# Patient Record
Sex: Female | Born: 1975 | Race: White | Hispanic: No | Marital: Married | Smoking: Never smoker
Health system: Southern US, Community
[De-identification: ages and names within clinical notes are randomized; demographics above are authoritative.]

## PROBLEM LIST (undated history)

## (undated) HISTORY — PX: APPENDECTOMY: SHX54

---

## 1998-02-09 ENCOUNTER — Emergency Department (HOSPITAL_COMMUNITY): Admission: EM | Admit: 1998-02-09 | Discharge: 1998-02-09 | Payer: Self-pay | Admitting: Emergency Medicine

## 1998-03-11 ENCOUNTER — Emergency Department (HOSPITAL_COMMUNITY): Admission: EM | Admit: 1998-03-11 | Discharge: 1998-03-11 | Payer: Self-pay | Admitting: Emergency Medicine

## 1998-04-13 ENCOUNTER — Emergency Department (HOSPITAL_COMMUNITY): Admission: EM | Admit: 1998-04-13 | Discharge: 1998-04-13 | Payer: Self-pay | Admitting: Emergency Medicine

## 1998-08-19 ENCOUNTER — Encounter: Payer: Self-pay | Admitting: Endocrinology

## 1998-08-19 ENCOUNTER — Ambulatory Visit (HOSPITAL_COMMUNITY): Admission: RE | Admit: 1998-08-19 | Discharge: 1998-08-19 | Payer: Self-pay | Admitting: Endocrinology

## 2007-06-14 ENCOUNTER — Emergency Department (HOSPITAL_COMMUNITY): Admission: EM | Admit: 2007-06-14 | Discharge: 2007-06-14 | Payer: Self-pay | Admitting: Emergency Medicine

## 2007-07-04 ENCOUNTER — Ambulatory Visit: Payer: Self-pay | Admitting: Family Medicine

## 2007-07-04 DIAGNOSIS — I10 Essential (primary) hypertension: Secondary | ICD-10-CM | POA: Insufficient documentation

## 2007-07-04 DIAGNOSIS — N3945 Continuous leakage: Secondary | ICD-10-CM

## 2007-07-04 DIAGNOSIS — D509 Iron deficiency anemia, unspecified: Secondary | ICD-10-CM

## 2007-07-05 ENCOUNTER — Encounter: Payer: Self-pay | Admitting: Family Medicine

## 2007-07-05 LAB — CONVERTED CEMR LAB
HCT: 41 % (ref 36.0–46.0)
Hemoglobin: 13.7 g/dL (ref 12.0–15.0)
MCHC: 33.4 g/dL (ref 30.0–36.0)
MCV: 87.6 fL (ref 78.0–100.0)
Platelets: 224 10*3/uL (ref 150–400)
RBC: 4.68 M/uL (ref 3.87–5.11)
RDW: 12.8 % (ref 11.5–15.5)
WBC: 7.5 10*3/uL (ref 4.0–10.5)

## 2007-08-15 ENCOUNTER — Telehealth (INDEPENDENT_AMBULATORY_CARE_PROVIDER_SITE_OTHER): Payer: Self-pay | Admitting: *Deleted

## 2007-08-15 ENCOUNTER — Ambulatory Visit: Payer: Self-pay | Admitting: Family Medicine

## 2007-08-15 DIAGNOSIS — E669 Obesity, unspecified: Secondary | ICD-10-CM | POA: Insufficient documentation

## 2007-08-15 LAB — CONVERTED CEMR LAB: Beta hcg, urine, semiquantitative: NEGATIVE

## 2007-08-28 ENCOUNTER — Ambulatory Visit: Payer: Self-pay | Admitting: Family Medicine

## 2007-11-07 ENCOUNTER — Telehealth: Payer: Self-pay | Admitting: Family Medicine

## 2007-11-23 ENCOUNTER — Ambulatory Visit: Payer: Self-pay | Admitting: Family Medicine

## 2007-11-23 LAB — CONVERTED CEMR LAB: Inflenza A Ag: NEGATIVE

## 2008-05-24 ENCOUNTER — Ambulatory Visit: Payer: Self-pay | Admitting: Family Medicine

## 2009-05-13 ENCOUNTER — Ambulatory Visit: Payer: Self-pay | Admitting: Family Medicine

## 2009-05-13 DIAGNOSIS — F341 Dysthymic disorder: Secondary | ICD-10-CM

## 2009-09-02 IMAGING — CR DG HAND COMPLETE 3+V*L*
3 series · 3 of 3 positions shown · non-contrast
Comparison: None.

CLINICAL DATA: Left hand pain following an injury.

LEFT HAND - COMPLETE 3+ VIEW

[view not recorded (1 of 3)]
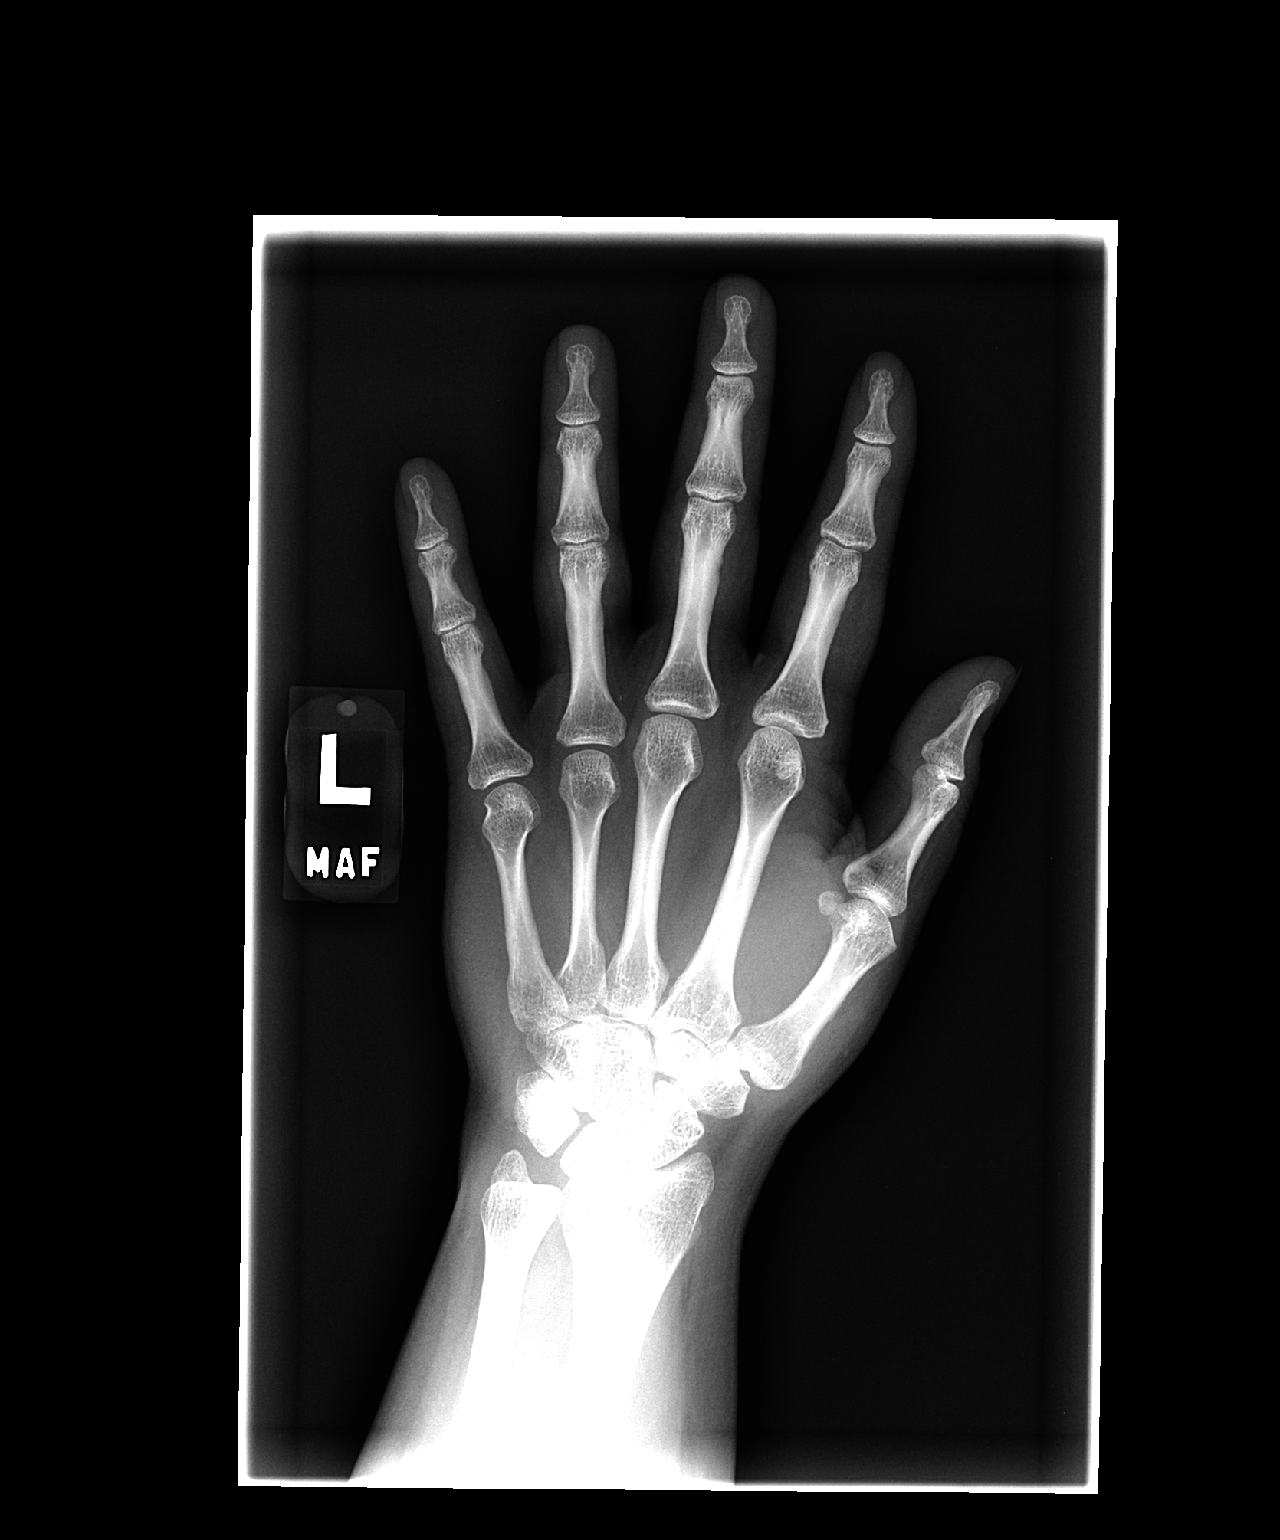

[view not recorded (2 of 3)]
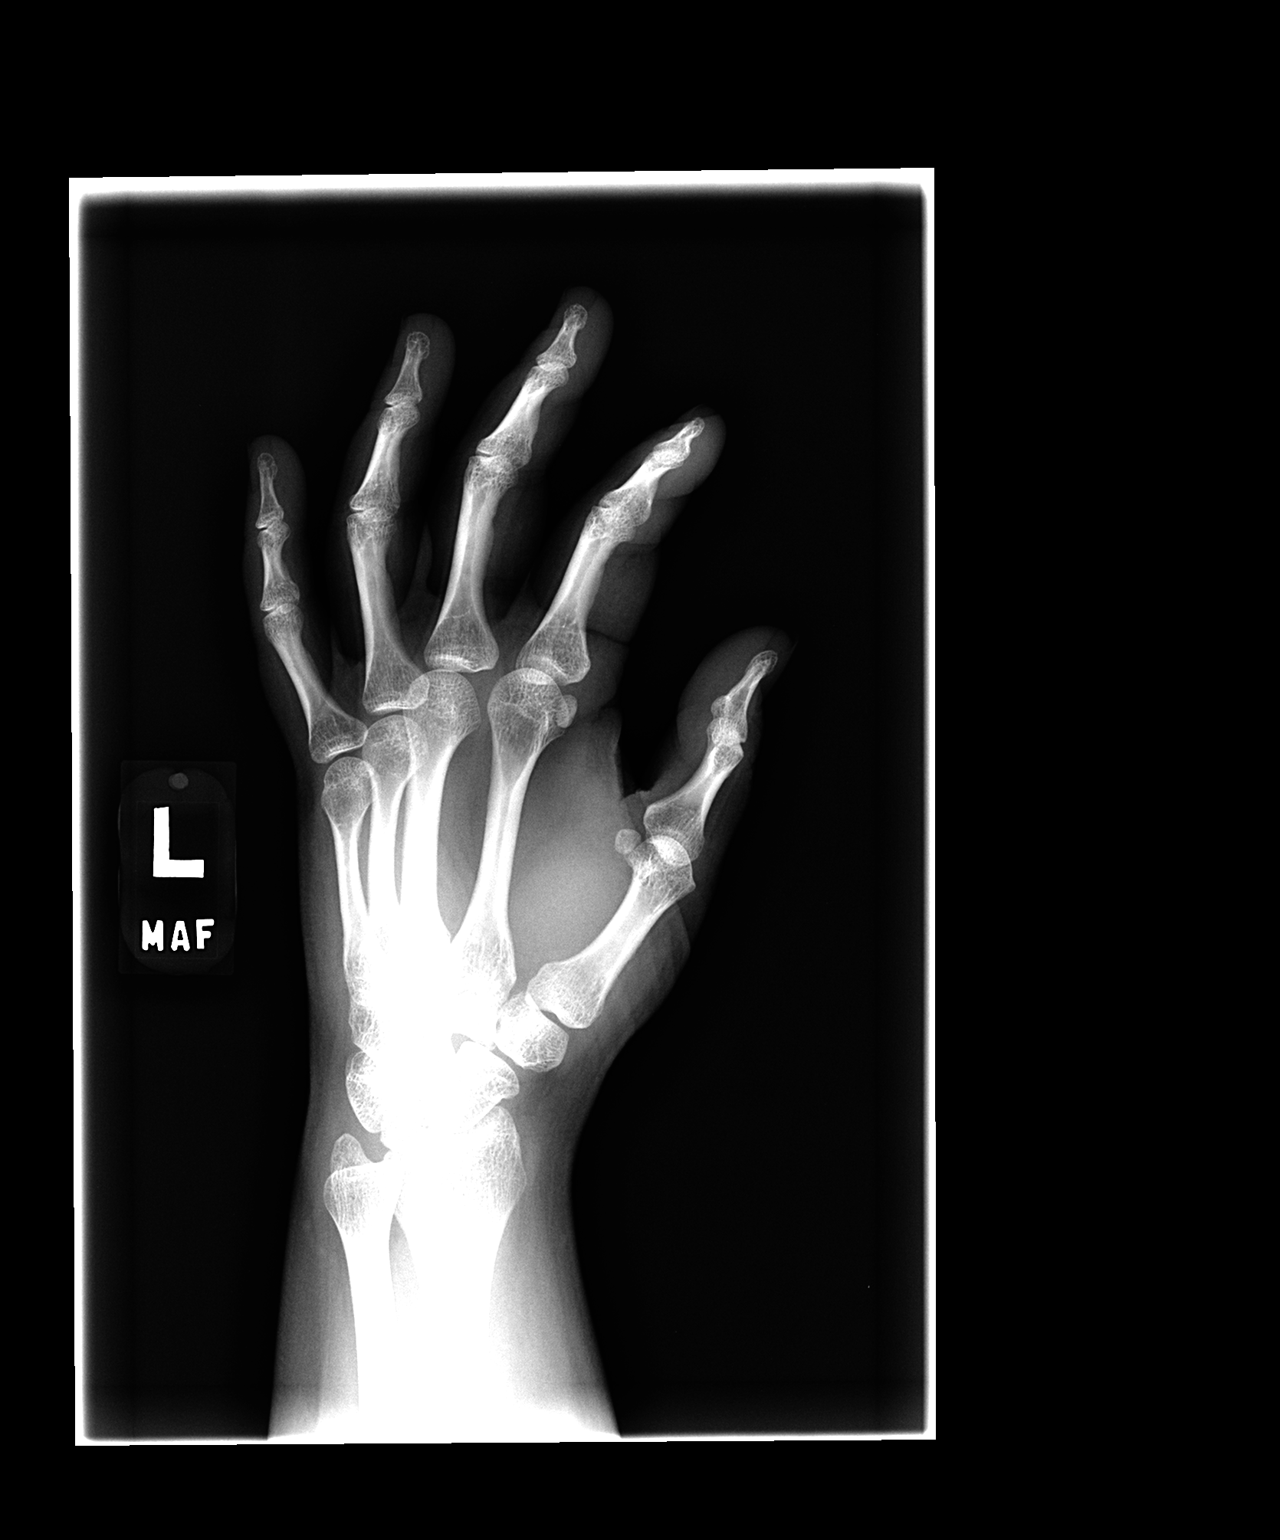

[view not recorded (3 of 3)]
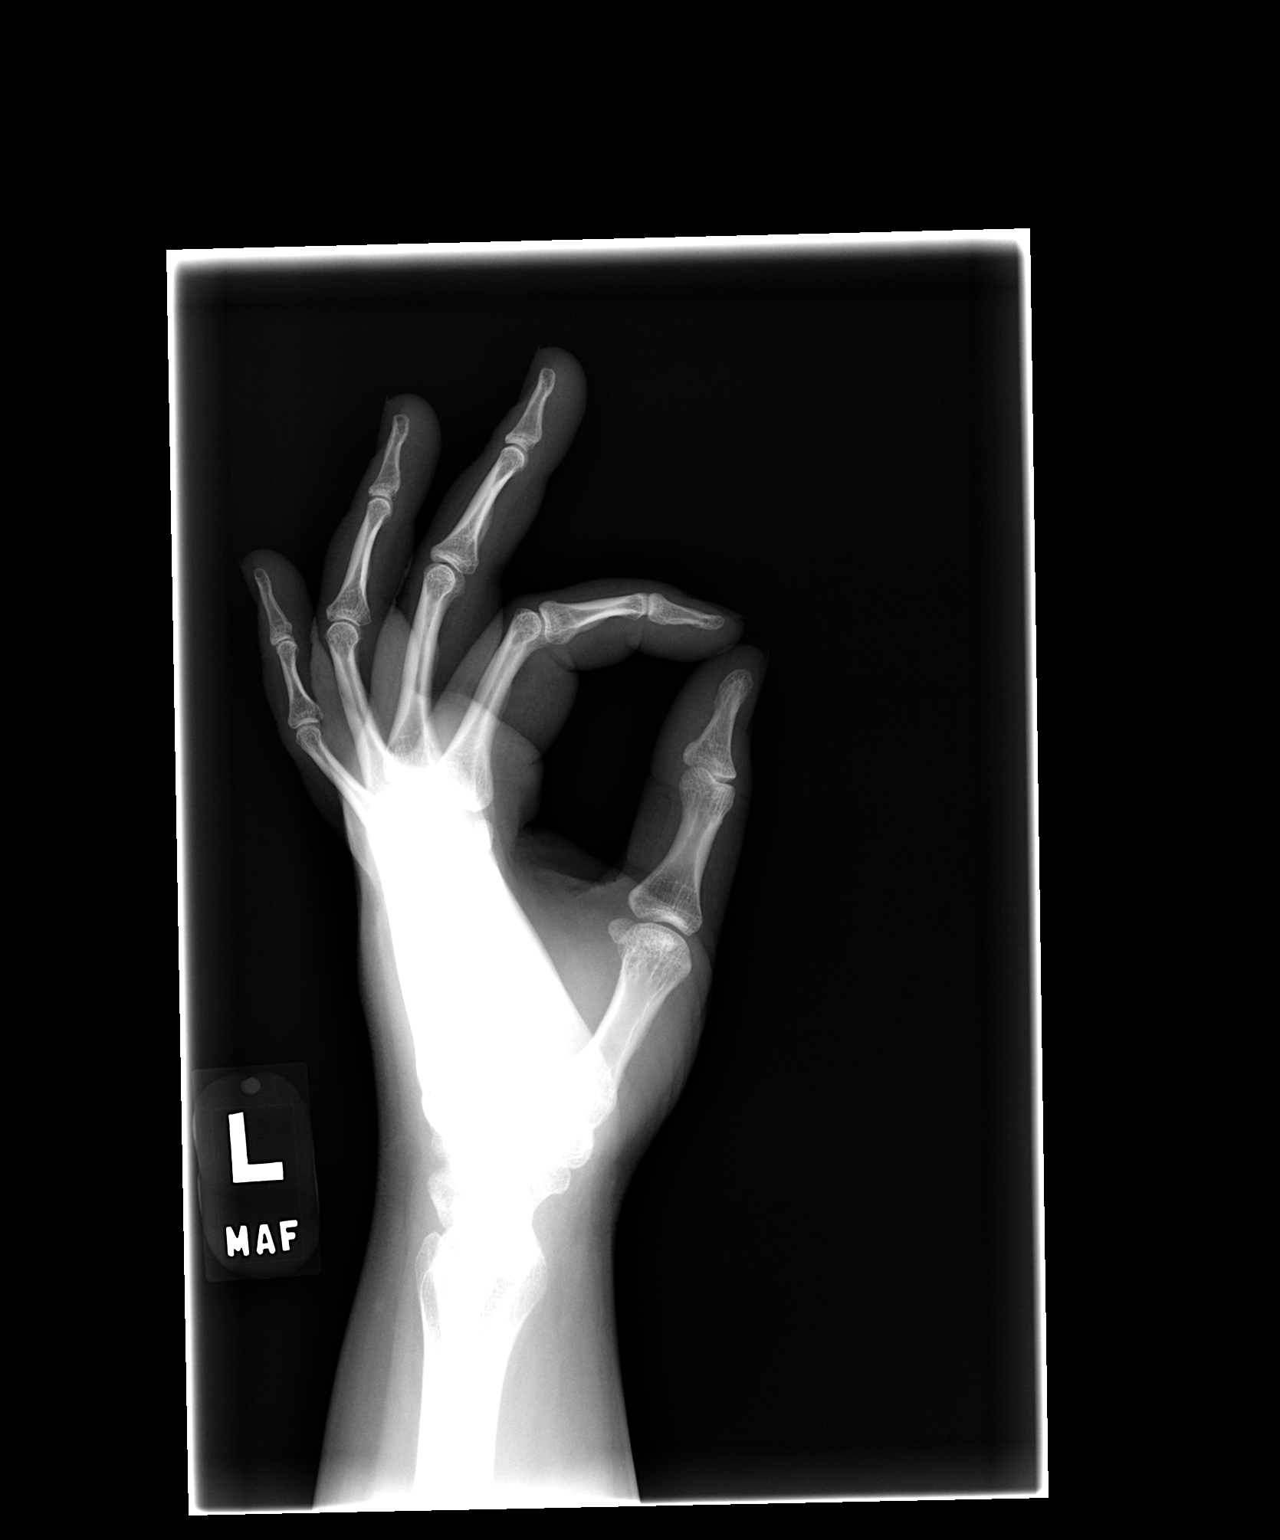

[3 of 3 positions shown; findings below may reference images not displayed]

FINDINGS: Mild soft tissue swelling involving the proximal index,
middle and ring fingers.  Tiny radiopaque density adjacent to the
proximal portion of the fourth proximal phalanx, radially.  This is
only seen on the PA view and may represent and artifact.  No
fracture or dislocation seen.
IMPRESSION: No fracture.

REF:W2 DICTATED: 05/24/2008 [DATE]

## 2010-05-06 ENCOUNTER — Ambulatory Visit
Admission: RE | Admit: 2010-05-06 | Discharge: 2010-05-06 | Payer: Self-pay | Source: Home / Self Care | Attending: Family Medicine | Admitting: Family Medicine

## 2010-05-06 DIAGNOSIS — J019 Acute sinusitis, unspecified: Secondary | ICD-10-CM | POA: Insufficient documentation

## 2010-05-19 NOTE — Letter (Signed)
Summary: Katharina Caper NP  Katharina Caper NP   Imported By: Lanelle Bal 05/17/2009 11:16:04  _____________________________________________________________________  External Attachment:    Type:   Image     Comment:   External Document

## 2010-05-19 NOTE — Assessment & Plan Note (Signed)
Summary: Acute situation depression, atypical CP   Vital Signs:  Patient profile:   35 year old female Height:      60 inches Weight:      207 pounds BMI:     40.57 Pulse rate:   78 / minute BP sitting:   133 / 88  (left arm) Cuff size:   regular  Vitals Entered By: Kathlene November (May 13, 2009 11:13 AM) CC: BP elevated at work today and was sent over   Primary Care Provider:  Linford Arnold C  CC:  BP elevated at work today and was sent over.  History of Present Illness: BP elevated at work today and was sent over. Feels realy stressed that her work is closing.  Hasn't sleep well since found out on 1/19. Feels like in a cloud because can't figure out what to do. Feels like it is causing chest pain. CP started this AM and has been intermittant today.  Feels like in shock.  Knows she has decisions to make. Has gained 7 pounds.  Feels she is starting to sabotage her health but eating poorly. Has stopped working out for the last 2 weeks.  When CP started this AM when to see the work nurse.  Had mid-sternal CP that started this AM. Thought initially it was from eating pizza the night before.  No alleviating or worsneing sxs. No diarphoresis or SOB.   Brought in copy of hte nurse note adn the normal EKG she had done t wokr. No diziness.   Current Medications (verified): 1)  P D Natal Vitamins/folic Acid   Tabs (Prenatal Multivit-Min-Fe-Fa) .... Take One Tablet By Mouth Three Times A Day 2)  Ibuprofen 800 Mg Tabs (Ibuprofen) .Marland Kitchen.. 1 By Mouth Q8hr Pc  Allergies (verified): 1)  ! Sulfa  Comments:  Nurse/Medical Assistant: The patient's medications and allergies were reviewed with the patient and were updated in the Medication and Allergy Lists. Kathlene November (May 13, 2009 11:14 AM)  Physical Exam  General:  Well-developed,well-nourished,in no acute distress; alert,appropriate and cooperative throughout examination   Impression & Recommendations:  Problem # 1:  DEPRESSION, SITUATIONAL,  ACUTE (ICD-300.4) Had a long discussion about options. Discussed utiliziing the counselors at work that are free to help her make some decisions about where to go with her future. Also discussed medications to help with her anxiety, insomnia, lack of focus, and tearfulness.  She would like to try this. Will start with citaloproam and have her f/u in 3 weeks .Call if any suicidal ideation, etc.  also let her know that her carb cravings et are likely from her fatigue and insomnia. 25 min spent face to face in counseling and discussion.   Problem # 2:  ESSENTIAL HYPERTENSION, BENIGN (ICD-401.1) BP looks great today and she has been off HCTZ for almost a year since she was able to lose weight. EKg was noraml. I really htink her CP was stress induced. She is owtherwise low risk.   The following medications were removed from the medication list:    Hydrochlorothiazide 25 Mg Tabs (Hydrochlorothiazide) .Marland Kitchen... Take 1 tablet by mouth once a day  Complete Medication List: 1)  P D Natal Vitamins/folic Acid Tabs (Prenatal multivit-min-fe-fa) .... Take one tablet by mouth three times a day 2)  Ibuprofen 800 Mg Tabs (Ibuprofen) .Marland Kitchen.. 1 by mouth q8hr pc 3)  Citalopram Hydrobromide 20 Mg Tabs (Citalopram hydrobromide) .... Take 1/2 tablet by mouth once a day forone week then increase to 1 tab daily. Prescriptions:  CITALOPRAM HYDROBROMIDE 20 MG TABS (CITALOPRAM HYDROBROMIDE) Take 1/2 tablet by mouth once a day forone week then increase to 1 tab daily.  #30 x 0   Entered and Authorized by:   Nani Gasser MD   Signed by:   Nani Gasser MD on 05/13/2009   Method used:   Electronically to        [facility] [address] [phone]* (retail)       [address]       [place], [place]  [address]       Ph: [phone]       Fax: [phone]   RxID:   [id number]

## 2010-05-21 NOTE — Assessment & Plan Note (Signed)
Summary: Sinusitis   Vital Signs:  Patient profile:   35 year old female Height:      60 inches Weight:      203 pounds Pulse rate:   97 / minute BP sitting:   136 / 102  (right arm) Cuff size:   regular  Vitals Entered By: Avon Gully CMA, Duncan Dull) (May 06, 2010 8:32 AM) CC: cough and congestion x 1 month   Primary Care Provider:  Linford Arnold C  CC:  cough and congestion x 1 month.  History of Present Illness: cough and congestion x 1 month.  No fever. Severe ,nasal congestion, green discharge.  Cough and now chest is sore.  No GI sxs. + sick contacts. Has tried multiple OTC meds, but just not getting better.    Current Medications (verified): 1)  P D Natal Vitamins/folic Acid   Tabs (Prenatal Multivit-Min-Fe-Fa) .... Take One Tablet By Mouth Three Times A Day 2)  Ibuprofen 800 Mg Tabs (Ibuprofen) .Marland Kitchen.. 1 By Mouth Q8hr Pc 3)  Citalopram Hydrobromide 20 Mg Tabs (Citalopram Hydrobromide) .... Take 1/2 Tablet By Mouth Once A Day Forone Week Then Increase To 1 Tab Daily.  Allergies (verified): 1)  ! Sulfa  Comments:  Nurse/Medical Assistant: The patient's medications and allergies were reviewed with the patient and were updated in the Medication and Allergy Lists. Avon Gully CMA, Duncan Dull) (May 06, 2010 8:33 AM)  Physical Exam  General:  Well-developed,well-nourished,in no acute distress; alert,appropriate and cooperative throughout examination Head:  Normocephalic and atraumatic without obvious abnormalities. No apparent alopecia or balding. Eyes:  No corneal or conjunctival inflammation noted. EOMI. Perrla. Ears:  External ear exam shows no significant lesions or deformities.  Otoscopic examination reveals clear canals, tympanic membranes are intact bilaterally without bulging, retraction, inflammation or discharge. Hearing is grossly normal bilaterally. Nose:  External nasal examination shows no deformity or inflammation.  Mouth:  Oral mucosa and oropharynx  without lesions or exudates.  Teeth in good repair. Neck:  No deformities, masses, or tenderness noted. Lungs:  Normal respiratory effort, chest expands symmetrically. Lungs are clear to auscultation, no crackles or wheezes. Heart:  Normal rate and regular rhythm. S1 and S2 normal without gallop, murmur, click, rub or other extra sounds. Pulses:  Radial 2+  Skin:  no rashes.   Cervical Nodes:  No lymphadenopathy noted Psych:  Cognition and judgment appear intact. Alert and cooperative with normal attention span and concentration. No apparent delusions, illusions, hallucinations   Impression & Recommendations:  Problem # 1:  SINUSITIS - ACUTE-NOS (ICD-461.9)  Her updated medication list for this problem includes:    Amoxicillin 875 Mg Tabs (Amoxicillin) .Marland Kitchen... Take 1 tablet by mouth two times a day for 10 days  Instructed on treatment. Call if symptoms persist or worsen.   Complete Medication List: 1)  P D Natal Vitamins/folic Acid Tabs (Prenatal multivit-min-fe-fa) .... Take one tablet by mouth three times a day 2)  Ibuprofen 800 Mg Tabs (Ibuprofen) .Marland Kitchen.. 1 by mouth q8hr pc 3)  Citalopram Hydrobromide 20 Mg Tabs (Citalopram hydrobromide) .... Take 1/2 tablet by mouth once a day forone week then increase to 1 tab daily. 4)  Amoxicillin 875 Mg Tabs (Amoxicillin) .... Take 1 tablet by mouth two times a day for 10 days  Patient Instructions: 1)  Call if not better in one week.  2)  Can take 5ml (1 teaspoon) at bedtime.  Prescriptions: AMOXICILLIN 875 MG TABS (AMOXICILLIN) Take 1 tablet by mouth two times a day for 10 days  #  20 x 0   Entered and Authorized by:   Nani Gasser MD   Signed by:   Nani Gasser MD on 05/06/2010   Method used:   Electronically to        [facility]  [address] [phone]* (retail)       [address]       [place], [place]  [address]       Ph: [phone]       Fax: [phone]   RxID:   [id number]    Orders Added: 1)  Est. Patient Level III  [09323]

## 2012-01-03 ENCOUNTER — Ambulatory Visit: Payer: Self-pay | Admitting: Family Medicine

## 2012-01-03 DIAGNOSIS — Z0289 Encounter for other administrative examinations: Secondary | ICD-10-CM

## 2017-08-11 ENCOUNTER — Emergency Department
Admission: EM | Admit: 2017-08-11 | Discharge: 2017-08-11 | Disposition: A | Discharge status: Home / Self Care | Payer: Self-pay | Source: Home / Self Care | Attending: Emergency Medicine | Admitting: Emergency Medicine

## 2017-08-11 ENCOUNTER — Other Ambulatory Visit: Payer: Self-pay

## 2017-08-11 DIAGNOSIS — J111 Influenza due to unidentified influenza virus with other respiratory manifestations: Secondary | ICD-10-CM

## 2017-08-11 DIAGNOSIS — R69 Illness, unspecified: Secondary | ICD-10-CM

## 2017-08-11 MED ORDER — OSELTAMIVIR PHOSPHATE 75 MG PO CAPS: ORAL_CAPSULE | ORAL | 0 refills | Status: DC

## 2017-08-11 NOTE — ED Triage Notes (Signed)
Started this am with generalized aches, chills, loss of appetite, and headache.  Son DX with flu a/b Monday.

## 2017-08-11 NOTE — ED Provider Notes (Addendum)
[facility]    CSN: [id number] Arrival date & time: 08/11/17  1034     History   Chief Complaint Chief Complaint  Patient presents with  . Chills  . Generalized Body Aches  . Fatigue    HPI Connie Sanders is a 42 y.o. female.   HPI FLU  HPI : Acute flu symptoms started this morning. Fever, not documented with temperature measurement, but she felt hot with chills, sweats, myalgias, fatigue, headache. Symptoms are progressively worsening, despite trying OTC fever reducing medicine and rest and fluids.  Has decreased appetite, but tolerating some liquids by mouth. No history of recent tick bite. Son was diagnosed with influenza with positive flu a and B test at pediatrician 3 days ago, and he is improving on Tamiflu.     History reviewed. No pertinent past medical history.  Patient Active Problem List   Diagnosis Date Noted  . SINUSITIS - ACUTE-NOS 05/06/2010  . DEPRESSION, SITUATIONAL, ACUTE 05/13/2009  . OBESITY 08/15/2007  . ANEMIA, IRON DEFICIENCY 07/04/2007  . ESSENTIAL HYPERTENSION, BENIGN 07/04/2007  . 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 Linford Arnold PCP in the past, but she admits she has been followed up there in over a year, and I urged her to follow-up with Dr.  Linford Arnold within the next 2 weeks for BP recheck and also to regularly see Dr. Linford Arnold for preventative care.  Past Surgical History:  Procedure Laterality Date  . APPENDECTOMY    . CESAREAN SECTION      OB History   None      Home Medications    Prior to Admission medications   Medication Sig Start Date End Date Taking? Authorizing Provider  oseltamivir (TAMIFLU) 75 MG capsule Starting today, take 1 capsule by mouth twice a day for 5 days. 08/11/17   Lajean Manes, MD    Family History History reviewed. No pertinent family history.  Social History Social History   Tobacco Use  . Smoking  status: Never Smoker  . Smokeless tobacco: Never Used  Substance Use Topics  . Alcohol use: Not Currently  . Drug use: Not Currently     Allergies   Sulfonamide derivatives   Review of Systems Review of Systems  Positive for fatigue, mild nasal congestion, mild swollen anterior neck glands, mild cough. Negative for sore throat,acute vision changes, stiff neck, focal weakness, syncope, seizures, respiratory distress, vomiting, diarrhea, GU symptoms, new rash.  No chest pain or shortness of breath Patient's last menstrual period was 08/06/2017. She denies chance of pregnancy.  Other than above, review of systems negative   Physical Exam Triage Vital Signs ED Triage Vitals  Enc Vitals Group     BP 08/11/17 1059 (!) 160/115     Pulse Rate 08/11/17 1059 72     Resp --      Temp 08/11/17 1059 98.5 F (36.9 C)     Temp Source 08/11/17 1059 Oral     SpO2 08/11/17 1059 99 %     Weight --      Height 08/11/17 1100 5' (1.524 m)     Head Circumference --      Peak Flow --      Pain Score 08/11/17 1100 0     Pain Loc --      Pain Edu? --      Excl. in GC? --    No data found.  Updated Vital Signs  BP (!) 160/115 (BP Location: Right Arm)   Pulse 72   Temp 98.5 F (36.9 C) (Oral)   Ht 5' (1.524 m)   LMP 08/06/2017   SpO2 99%   BMI 39.65 kg/m    Physical Exam  Constitutional: She appears well-developed and well-nourished.  Non-toxic appearance. She appears ill (very fatigued, but no cardiorespiratory distress). No distress.  HENT:  Head: Normocephalic and atraumatic.  Right Ear: Tympanic membrane and external ear normal.  Left Ear: Tympanic membrane and external ear normal.  Nose: Rhinorrhea present.  Mouth/Throat: Mucous membranes are normal. No oropharyngeal exudate or posterior oropharyngeal erythema.  Eyes: Conjunctivae are normal. Right eye exhibits no discharge. Left eye exhibits no discharge. No scleral icterus.  Neck: Neck supple.  Cardiovascular: Normal  rate, regular rhythm and normal heart sounds.  Pulmonary/Chest: Breath sounds normal. No stridor. No respiratory distress. She has no wheezes. She has no rales.  Abdominal: Soft. There is no tenderness.  Musculoskeletal: She exhibits no edema.  Lymphadenopathy:    She has cervical adenopathy (mild shoddy anterior cervical nodes).  Neurological: She is alert.  Skin: Skin is warm and intact. No rash noted. She is diaphoretic.  Psychiatric: She has a normal mood and affect.  Nursing note and vitals reviewed.    UC Treatments / Results  Labs (all labs ordered are listed, but only abnormal results are displayed) Labs Reviewed - No data to display  EKG None Radiology No results found.  Procedures Procedures (including critical care time)  Medications Ordered in UC Medications - No data to display   Initial Impression / Assessment and Plan / UC Course  I have reviewed the triage vital signs and the nursing notes.  Pertinent labs & imaging results that were available during my care of the patient were reviewed by me and considered in my medical decision making (see chart for details).     She has classical history, symptoms and physical exam consistent with influenza.  She likely has influenza, especially given son diagnosed with influenza a and B with positive tests, and husband has similar influenza symptoms. Work-up and treatment options discussed, as well as risks, benefits, alternatives.  Patient declined any flu testing and prefers treating with Tamiflu and I agree. Patient voiced understanding and agreement with the following plans: Tamiflu prescribed 75 mg twice daily for 5 days. Other symptomatic care discussed. Follow-up with Dr. Eppie GibsonMetheny in 5-7 days if not improving, or sooner if symptoms become worse. Also, advised to have BP rechecked by Dr. Glade LloydMatheny within the next 2 weeks and explained risks of not doing so. Precautions discussed. Red flags discussed. Questions  invited and answered. Patient voiced understanding and agreement.    Final Clinical Impressions(s) / UC Diagnoses   Final diagnoses:  Influenza-like illness    ED Discharge Orders        Ordered    oseltamivir (TAMIFLU) 75 MG capsule     08/11/17 1127       Controlled Substance Prescriptions  Controlled Substance Registry consulted? Not Applicable   Lajean ManesMassey, David, MD 08/11/17 1141    Lajean ManesMassey, David, MD 08/11/17 1149

## 2017-09-28 ENCOUNTER — Ambulatory Visit: Payer: Self-pay | Admitting: Family Medicine

## 2017-09-28 ENCOUNTER — Encounter: Payer: Self-pay | Admitting: Family Medicine

## 2017-09-28 VITALS — BP 126/88 | HR 91 | Temp 98.1°F | Resp 18 | Ht 60.0 in | Wt 225.4 lb

## 2017-09-28 DIAGNOSIS — Z0000 Encounter for general adult medical examination without abnormal findings: Secondary | ICD-10-CM

## 2017-09-28 DIAGNOSIS — Z6841 Body Mass Index (BMI) 40.0 and over, adult: Secondary | ICD-10-CM

## 2017-09-28 NOTE — Progress Notes (Signed)
  Tuberculosis Risk Questionnaire  1. No Were you born outside the [place] in one of the following parts of the world: Africa, Asia, Central America, [place] America or Eastern [place]?    2. No Have you traveled outside the [place] and lived for more than one month in one of the following parts of the world: Africa, [place], [place], [place] or Eastern [place]?    3. No Do you have a compromised immune system such as from any of the following conditions:HIV/AIDS, organ or bone marrow transplantation, diabetes, immunosuppressive medicines (e.g. Prednisone, Remicaide), leukemia, lymphoma, cancer of the head or neck, gastrectomy or jejunal bypass, end-stage renal disease (on dialysis), or silicosis?     4. Yes  Have you ever or do you plan on working in: a residential care center, a health care facility, a jail or prison or homeless shelter?    5. No Have you ever: injected illegal drugs, used crack cocaine, lived in a homeless shelter  or been in jail or prison?     6. No Have you ever been exposed to anyone with infectious tuberculosis?  7. No Have you ever had a BCG vaccine? (BCG is a vaccine for tuberculosis  (TB) used in OTHER countries, NOT in the [place]).  8. No Have you ever been advised by a health care provider NOT to have a TB skin test?  9. No Have you ever had a POSITIVE TB skin test?  IF SO, when? n/a  IF SO, were you treated with INH? n/a  IF SO, where? n/a  Tuberculosis Symptom Questionnaire  Do you currently have any of the following symptoms?  1. No Unexplained cough lasting more than 3 weeks?   2. No Unexplained fever lasting more than 3 weeks.   3. No Night Sweats (sweating that leaves the bedclothes and sheets wet)     4. No Shortness of Breath   5. No Chest Pain   6. No Unintentional weight loss    7. No Unexplained fatigue (very tired for no reason)    

## 2017-09-28 NOTE — Patient Instructions (Addendum)
It is important that you consider getting the hepatitis vaccines, both hepatitis A and hepatitis B  Work on the weight loss by eating less and exercising more as discussed  Arrange to get a primary care physician  Return as needed    IF you received an x-ray today, you will receive an invoice from [facility]. Please contact [facility] at [phone] with questions or concerns regarding your invoice.   IF you received labwork today, you will receive an invoice from [facility]. Please contact [facility] at [phone] with questions or concerns regarding your invoice.   Our billing staff will not be able to assist you with questions regarding bills from these companies.  You will be contacted with the lab results as soon as they are available. The fastest way to get your results is to activate your My Chart account. Instructions are located on the last page of this paperwork. If you have not heard from us regarding the results in 2 weeks, please contact this office.

## 2017-09-28 NOTE — Progress Notes (Signed)
Patient ID: Connie Sanders, female    DOB: Feb 18, 1976  Age: 42 y.o. MRN: [id number]  Chief Complaint  Patient presents with  . 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  She needs a physical for the program. No major acute complaints.  Patient is here for physical examination  Past medical history: Surgeries: D&C, appendectomy Gravida 1 para 1 Medical illnesses: None Allergies: None Medications: None except for something for the fungal toenails Tobacco: No Alcohol: No  Social history: Married, one child 42 years old, works in hospital administration  Review of systems: Unremarkable   Current allergies, medications, problem list, past/family and social histories reviewed.  Objective:  BP 126/88 (BP Location: Left Arm, Patient Position: Sitting, Cuff Size: Large)   Pulse 91   Temp 98.1 F (36.7 C) (Oral)   Resp 18   Ht 5' (1.524 m)   Wt 225 lb 6.4 oz (102.2 kg)   SpO2 99%   BMI 44.02 kg/m   This lady, pleasant and oriented.  No acute distress.  TMs normal.  Throat clear.  Teeth good.  Neck supple without nodes or thyromegaly.  No carotid bruits.  Chest clear to auscultation.  Heart rate without murmurs.  Abdomen soft without mass or tenderness.  Throat is unremarkable.  Skin unremarkable.  Spine unremarkable.  Assessment & Plan:   Assessment: 1. Annual physical exam   2. Class 3 severe obesity without serious comorbidity with body mass index (BMI) of 45.0 to 49.9 in adult, unspecified obesity type [facility](HCC)       Plan: College physical exam, normal except for the obesity.  No orders of the defined types were placed in this encounter.   No orders of the defined types were placed in this encounter.        Patient Instructions   It is important that you consider getting the hepatitis vaccines, both hepatitis A and hepatitis B  Work on the weight loss  by eating less and exercising more as discussed  Arrange to get a primary care physician  Return as needed    IF you received an x-ray today, you will receive an invoice from [facility]. Please contact [facility] at [phone] with questions or concerns regarding your invoice.   IF you received labwork today, you will receive an invoice from [facility]. Please contact [facility] at [phone] with questions or concerns regarding your invoice.   Our billing staff will not be able to assist you with questions regarding bills from these companies.  You will be contacted with the lab results as soon as they are available. The fastest way to get your results is to activate your My Chart account. Instructions are located on the last page of this paperwork. If you have not heard from us regarding the results in 2 weeks, please contact this office.         Return if symptoms worsen or fail to improve.   Janace Hoardavid Hopper, MD 09/28/2017

## 2018-05-27 ENCOUNTER — Other Ambulatory Visit: Payer: Self-pay

## 2018-05-27 ENCOUNTER — Emergency Department
Admission: EM | Admit: 2018-05-27 | Discharge: 2018-05-27 | Disposition: A | Discharge status: Home / Self Care | Payer: Managed Care, Other (non HMO) | Source: Home / Self Care

## 2018-05-27 ENCOUNTER — Encounter: Payer: Self-pay | Admitting: Emergency Medicine

## 2018-05-27 DIAGNOSIS — L089 Local infection of the skin and subcutaneous tissue, unspecified: Secondary | ICD-10-CM

## 2018-05-27 DIAGNOSIS — H04201 Unspecified epiphora, right lacrimal gland: Secondary | ICD-10-CM

## 2018-05-27 DIAGNOSIS — R590 Localized enlarged lymph nodes: Secondary | ICD-10-CM | POA: Diagnosis not present

## 2018-05-27 LAB — POCT CBC W AUTO DIFF (K'VILLE URGENT CARE)

## 2018-05-27 MED ORDER — DOXYCYCLINE HYCLATE 100 MG PO CAPS
100.0000 mg | ORAL_CAPSULE | Freq: Two times a day (BID) | ORAL | 0 refills | Status: DC
Start: 2018-05-27 — End: 2018-05-27

## 2018-05-27 MED ORDER — DOXYCYCLINE HYCLATE 100 MG PO CAPS: 100.0000 mg | ORAL_CAPSULE | Freq: Two times a day (BID) | ORAL | 0 refills | Status: AC

## 2018-05-27 NOTE — ED Provider Notes (Signed)
[facility]    CSN: [id number] Arrival date & time: 05/27/18  1059     History   Chief Complaint Chief Complaint  Patient presents with  . Conjunctivitis  . Otalgia    swollen lymph node behind right ear    HPI Connie Sanders is a 43 y.o. female.   HPI  Connie Sanders is a 43 y.o. female presenting to UC with c/o Right eye watering with pain in Right ear as well as a tender swollen gland below and behind her Right ear for about 3 days. Pain gradually worsening.  She also noticed 2 small red bumps on the Right side of her frontal scalp but denies pain in the area. Denies cough, congestion, sore throat or tooth pain. Denies fever, chills, n/v/d. She has tried Tylenol with mild relief.    History reviewed. No pertinent past medical history.  Patient Active Problem List   Diagnosis Date Noted  . SINUSITIS - ACUTE-NOS 05/06/2010  . DEPRESSION, SITUATIONAL, ACUTE 05/13/2009  . OBESITY 08/15/2007  . ANEMIA, IRON DEFICIENCY 07/04/2007  . ESSENTIAL HYPERTENSION, BENIGN 07/04/2007  . URINARY INCONTINENCE, PASSIVE, CONTINUOUS LEAKAGE 07/04/2007    Past Surgical History:  Procedure Laterality Date  . APPENDECTOMY    . CESAREAN SECTION      OB History   No obstetric history on file.      Home Medications    Prior to Admission medications   Medication Sig Start Date End Date Taking? Authorizing Provider  doxycycline (VIBRAMYCIN) 100 MG capsule Take 1 capsule (100 mg total) by mouth 2 (two) times daily for 10 days. 05/27/18 06/06/18  Lurene Shadow, PA-C  Multiple Vitamin (MULTI-DAY VITAMINS PO) Take by mouth.    [provider]    Family History History reviewed. No pertinent family history.  Social History Social History   Tobacco Use  . Smoking status: Never Smoker  . Smokeless tobacco: Never Used  Substance Use Topics  . Alcohol use: Not Currently  . Drug use: Not Currently     Allergies   Sulfonamide derivatives   Review of  Systems Review of Systems  Constitutional: Negative for chills and fever.  HENT: Positive for ear pain (Right). Negative for congestion, sore throat, trouble swallowing and voice change.   Respiratory: Positive for cough. Negative for shortness of breath.   Cardiovascular: Negative for chest pain and palpitations.  Gastrointestinal: Negative for abdominal pain, diarrhea, nausea and vomiting.  Musculoskeletal: Positive for neck pain (swollen lymph node). Negative for arthralgias, back pain, myalgias and neck stiffness.  Skin: Negative for rash.     Physical Exam Triage Vital Signs ED Triage Vitals  Enc Vitals Group     BP 05/27/18 1127 (!) 143/90     Pulse Rate 05/27/18 1127 86     Resp --      Temp 05/27/18 1127 98.1 F (36.7 C)     Temp Source 05/27/18 1127 Oral     SpO2 05/27/18 1127 100 %     Weight 05/27/18 1128 226 lb 12.8 oz (102.9 kg)     Height 05/27/18 1128 5' (1.524 m)     Head Circumference --      Peak Flow --      Pain Score 05/27/18 1127 3     Pain Loc --      Pain Edu? --      Excl. in GC? --    No data found.  Updated Vital Signs BP (!) 143/90 (BP Location:  Right Arm)   Pulse 86   Temp 98.1 F (36.7 C) (Oral)   Ht 5' (1.524 m)   Wt 226 lb 12.8 oz (102.9 kg)   SpO2 100%   BMI 44.29 kg/m   Visual Acuity Right Eye Distance:   Left Eye Distance:   Bilateral Distance:    Right Eye Near:   Left Eye Near:    Bilateral Near:     Physical Exam Vitals signs and nursing note reviewed.  Constitutional:      Appearance: Normal appearance. She is well-developed.  HENT:     Head: Normocephalic and atraumatic.      Right Ear: Tympanic membrane normal.     Left Ear: Tympanic membrane normal.     Nose: Nose normal.     Right Sinus: No maxillary sinus tenderness or frontal sinus tenderness.     Left Sinus: No maxillary sinus tenderness or frontal sinus tenderness.     Mouth/Throat:     Lips: Pink.     Mouth: Mucous membranes are moist.     Pharynx:  Oropharynx is clear. Uvula midline.  Neck:     Musculoskeletal: Normal range of motion and neck supple.  Cardiovascular:     Rate and Rhythm: Normal rate and regular rhythm.  Pulmonary:     Effort: Pulmonary effort is normal. No respiratory distress.     Breath sounds: Normal breath sounds. No stridor. No wheezing or rhonchi.  Musculoskeletal: Normal range of motion.  Lymphadenopathy:     Cervical: Cervical adenopathy (Right side preauricular and submandibular ) present.  Skin:    General: Skin is warm and dry.  Neurological:     Mental Status: She is alert and oriented to person, place, and time.  Psychiatric:        Behavior: Behavior normal.      UC Treatments / Results  Labs (all labs ordered are listed, but only abnormal results are displayed) Labs Reviewed  POCT CBC W AUTO DIFF ([facility])    EKG None  Radiology No results found.  Procedures Procedures (including critical care time)  Medications Ordered in UC Medications - No data to display  Initial Impression / Assessment and Plan / UC Course  I have reviewed the triage vital signs and the nursing notes.  Pertinent labs & imaging results that were available during my care of the patient were reviewed by me and considered in my medical decision making (see chart for details).    Initially questioned possible shingles with red pustules on scalp but they are non-tender. Doubtful shingles.  Suspected reactive lymph nodes, possible early ear infection vs dental infection. Will start on antibiotics Encouraged warm compresses.  F/u with PCP   Final Clinical Impressions(s) / UC Diagnoses   Final diagnoses:  Cervical lymphadenopathy  Watering of right eye  Skin pustule     Discharge Instructions      Please take antibiotics as prescribed and be sure to complete entire course even if you start to feel better to ensure infection does not come back.  You may take 500mg  acetaminophen every 4-6  hours or in combination with ibuprofen 400-600mg  every 6-8 hours as needed for pain, inflammation, and fever.  Please follow up with family medicine in 1 week if not improving, sooner if worsening.     ED Prescriptions    Medication Sig Dispense Auth. Provider   doxycycline (VIBRAMYCIN) 100 MG capsule  (Status: Discontinued) Take 1 capsule (100 mg total) by mouth 2 (two)  times daily for 10 days. 20 capsule Waylan RocherPhelps, Stepan Verrette O, PA-C   doxycycline (VIBRAMYCIN) 100 MG capsule Take 1 capsule (100 mg total) by mouth 2 (two) times daily for 10 days. 20 capsule Lurene ShadowPhelps, Sadhana Frater O, PA-C     Controlled Substance Prescriptions [place] Controlled Substance Registry consulted? Not Applicable   Rolla Platehelps, Willia Lampert O, PA-C 05/28/18 [id number]

## 2018-05-27 NOTE — Discharge Instructions (Signed)
°  Please take antibiotics as prescribed and be sure to complete entire course even if you start to feel better to ensure infection does not come back.  You may take 500mg  acetaminophen every 4-6 hours or in combination with ibuprofen 400-600mg  every 6-8 hours as needed for pain, inflammation, and fever.  Please follow up with family medicine in 1 week if not improving, sooner if worsening.

## 2018-05-27 NOTE — ED Triage Notes (Signed)
Here with right eye drainage and radiating ear pain, swollen gland behind R ear. Started 3 days ago. Denies fever- tried Tylenol for pain

## 2018-06-02 ENCOUNTER — Emergency Department (INDEPENDENT_AMBULATORY_CARE_PROVIDER_SITE_OTHER)
Admission: EM | Admit: 2018-06-02 | Discharge: 2018-06-02 | Disposition: A | Discharge status: Home / Self Care | Payer: Managed Care, Other (non HMO) | Source: Home / Self Care | Attending: Family Medicine | Admitting: Family Medicine

## 2018-06-02 ENCOUNTER — Encounter: Payer: Self-pay | Admitting: Emergency Medicine

## 2018-06-02 DIAGNOSIS — M26621 Arthralgia of right temporomandibular joint: Secondary | ICD-10-CM | POA: Diagnosis not present

## 2018-06-02 DIAGNOSIS — R030 Elevated blood-pressure reading, without diagnosis of hypertension: Secondary | ICD-10-CM

## 2018-06-02 MED ORDER — PREDNISONE 20 MG PO TABS: ORAL_TABLET | ORAL | 0 refills | Status: DC

## 2018-06-02 NOTE — Discharge Instructions (Signed)
Put ice on the painful area: Put ice in a plastic bag. Place a towel between your skin and the bag. Leave the ice on for 20 minutes, 2-3 times a day.  May take Tylenol as needed for pain.  Monitor blood pressure more frequently at different times of day and record on a calendar.  Minimize caffeine containing drinks.  Minimize salt intake.

## 2018-06-02 NOTE — ED Triage Notes (Signed)
Pt c/o right sided facial and ear pain that is stabbing and sharp. Pain is intermittent. She is on doxy for swollen lymph node from last visit and reports that has improved.

## 2018-06-02 NOTE — ED Provider Notes (Signed)
[facility]    CSN: [id number] Arrival date & time: 06/02/18  1613     History   Chief Complaint Chief Complaint  Patient presents with  . Otalgia    HPI Connie Sanders is a 43 y.o. female.   Patient complains of sharp stabbing pain in her right ear and surrounding area.  No nasal congestion, sore throat, or fever.  She was recently started on doxycycline for swollen lymph node that has improved. She states that her BP is usually not elevated but she admits drinking several soft drinks today.  The history is provided by the patient.    History reviewed. No pertinent past medical history.  Patient Active Problem List   Diagnosis Date Noted  . SINUSITIS - ACUTE-NOS 05/06/2010  . DEPRESSION, SITUATIONAL, ACUTE 05/13/2009  . OBESITY 08/15/2007  . ANEMIA, IRON DEFICIENCY 07/04/2007  . ESSENTIAL HYPERTENSION, BENIGN 07/04/2007  . URINARY INCONTINENCE, PASSIVE, CONTINUOUS LEAKAGE 07/04/2007    Past Surgical History:  Procedure Laterality Date  . APPENDECTOMY    . CESAREAN SECTION      OB History   No obstetric history on file.      Home Medications    Prior to Admission medications   Medication Sig Start Date End Date Taking? Authorizing Provider  doxycycline (VIBRAMYCIN) 100 MG capsule Take 1 capsule (100 mg total) by mouth 2 (two) times daily for 10 days. 05/27/18 06/06/18  Lurene Shadow, PA-C  Multiple Vitamin (MULTI-DAY VITAMINS PO) Take by mouth.    [provider]  predniSONE (DELTASONE) 20 MG tablet Take one tab by mouth twice daily for 4 days, then one daily. Take with food. 06/02/18   Lattie Haw, MD    Family History History reviewed. No pertinent family history.  Social History Social History   Tobacco Use  . Smoking status: Never Smoker  . Smokeless tobacco: Never Used  Substance Use Topics  . Alcohol use: Not Currently  . Drug use: Not Currently     Allergies   Sulfonamide derivatives   Review of  Systems Review of Systems No sore throat No cough No pleuritic pain No wheezing No nasal congestion No post-nasal drainage No sinus pain/pressure No itchy/red eyes + right earache No hemoptysis No SOB No fever/chills No nausea No vomiting No abdominal pain No diarrhea No urinary symptoms No skin rash No fatigue No myalgias No headache    Physical Exam Triage Vital Signs ED Triage Vitals  Enc Vitals Group     BP 06/02/18 1638 (!) 180/120     Pulse --      Resp --      Temp 06/02/18 1638 98 F (36.7 C)     Temp Source 06/02/18 1638 Oral     SpO2 06/02/18 1638 97 %     Weight 06/02/18 1639 224 lb (101.6 kg)     Height --      Head Circumference --      Peak Flow --      Pain Score 06/02/18 1639 0     Pain Loc --      Pain Edu? --      Excl. in GC? --    No data found.  Updated Vital Signs BP (!) 180/120 (BP Location: Right Arm) Comment: recheked pt. same reading. states she has not had physical with pcp in years.   Temp 98 F (36.7 C) (Oral)   Wt 101.6 kg   SpO2 97%   BMI 43.75 kg/m  Visual Acuity Right Eye Distance:   Left Eye Distance:   Bilateral Distance:    Right Eye Near:   Left Eye Near:    Bilateral Near:     Physical Exam Nursing notes and Vital Signs reviewed. Appearance:  Patient appears stated age, and in no acute distress Eyes:  Pupils are equal, round, and reactive to light and accomodation.  Extraocular movement is intact.  Conjunctivae are not inflamed  Ears:  Canals normal.  Tympanic membranes normal.  There is distinct tenderness to palpation over the right TMJ. Nose:  Normal turbinates.  No sinus tenderness.   Pharynx:  Normal Neck:  Supple.  No adenopathy  Lungs:  Clear to auscultation.  Breath sounds are equal.  Moving air well. Heart:  Regular rate and rhythm without murmurs, rubs, or gallops.  Abdomen:  Nontender without masses or hepatosplenomegaly.  Bowel sounds are present.  No CVA or flank tenderness.  Extremities:   No edema.  Skin:  No rash present.    UC Treatments / Results  Labs (all labs ordered are listed, but only abnormal results are displayed) Labs Reviewed - No data to display  EKG None  Radiology No results found.  Procedures Procedures (including critical care time)  Medications Ordered in UC Medications - No data to display  Initial Impression / Assessment and Plan / UC Course  I have reviewed the triage vital signs and the nursing notes.  Pertinent labs & imaging results that were available during my care of the patient were reviewed by me and considered in my medical decision making (see chart for details).    Begin prednisone burst/taper.  Finish antibiotic. Followup with Family Doctor for BP management.   Final Clinical Impressions(s) / UC Diagnoses   Final diagnoses:  Arthralgia of right temporomandibular joint  Elevated blood pressure reading without diagnosis of hypertension     Discharge Instructions     Put ice on the painful area: Put ice in a plastic bag. Place a towel between your skin and the bag. Leave the ice on for 20 minutes, 2-3 times a day.  May take Tylenol as needed for pain.  Monitor blood pressure more frequently at different times of day and record on a calendar.  Minimize caffeine containing drinks.  Minimize salt intake.    ED Prescriptions    Medication Sig Dispense Auth. Provider   predniSONE (DELTASONE) 20 MG tablet Take one tab by mouth twice daily for 4 days, then one daily. Take with food. 12 tablet Lattie Haw, MD        Lattie Haw, MD 06/05/18 2211

## 2019-05-12 ENCOUNTER — Other Ambulatory Visit: Payer: Self-pay

## 2019-05-12 ENCOUNTER — Emergency Department
Admission: EM | Admit: 2019-05-12 | Discharge: 2019-05-12 | Disposition: A | Discharge status: Home / Self Care | Payer: Managed Care, Other (non HMO) | Source: Home / Self Care | Attending: Emergency Medicine | Admitting: Emergency Medicine

## 2019-05-12 ENCOUNTER — Encounter: Payer: Self-pay | Admitting: Emergency Medicine

## 2019-05-12 DIAGNOSIS — Z20822 Contact with and (suspected) exposure to covid-19: Secondary | ICD-10-CM

## 2019-05-12 DIAGNOSIS — R062 Wheezing: Secondary | ICD-10-CM

## 2019-05-12 DIAGNOSIS — R430 Anosmia: Secondary | ICD-10-CM | POA: Diagnosis not present

## 2019-05-12 DIAGNOSIS — I10 Essential (primary) hypertension: Secondary | ICD-10-CM | POA: Diagnosis not present

## 2019-05-12 MED ORDER — AMLODIPINE BESYLATE 5 MG PO TABS: 5.0000 mg | ORAL_TABLET | Freq: Every day | ORAL | 0 refills | Status: DC

## 2019-05-12 NOTE — ED Triage Notes (Signed)
Pt states she had a negative covid test last Monday and another negative test done Friday. She states she has ben coughing and having some wheezing.

## 2019-05-12 NOTE — ED Provider Notes (Addendum)
HPI  SUBJECTIVE:  Connie Sanders is a 44 y.o. female who presents with inability to taste and smell starting 5 days ago.  She reports chills, sore throat, occasional cough and wheezing with exertion.  States that she has had 2 negative Covid PCR tests since symptoms started.  No fevers, body aches, headaches, nasal congestion, fatigue, shortness of breath, nausea, vomiting, diarrhea, abdominal pain.  Last known exposure to Covid was 20 days ago.  She denies head trauma.  She has been taking Tylenol, hot tea,  using a heating pad with improvement in her symptoms.  Last dose of Tylenol was within 4 to 6 hours of evaluation.  No aggravating factors.  She has a past medical history of hypertension, but has not been on any medication for years, asthma.  No history of smoking, GERD, diabetes, MI, stroke, coronary disease, chronic kidney disease, HIV, cancer, immunocompromise.  PMD: None.   History reviewed. No pertinent past medical history.  Past Surgical History:  Procedure Laterality Date  . APPENDECTOMY    . CESAREAN SECTION      History reviewed. No pertinent family history.  Social History   Tobacco Use  . Smoking status: Never Smoker  . Smokeless tobacco: Never Used  Substance Use Topics  . Alcohol use: Not Currently  . Drug use: Not Currently    No current facility-administered medications for this encounter.  Current Outpatient Medications:  .  buPROPion (WELLBUTRIN) 100 MG tablet, Take 100 mg by mouth 2 (two) times daily., Disp: , Rfl:  .  hydrOXYzine (ATARAX/VISTARIL) 10 MG tablet, Take 10 mg by mouth 3 (three) times daily as needed., Disp: , Rfl:  .  amLODipine (NORVASC) 5 MG tablet, Take 1 tablet (5 mg total) by mouth daily., Disp: 30 tablet, Rfl: 0 .  Multiple Vitamin (MULTI-DAY VITAMINS PO), Take by mouth., Disp: , Rfl:   Allergies  Allergen Reactions  . Sulfonamide Derivatives Anaphylaxis    REACTION: facial swelling     ROS  As noted in HPI.   Physical  Exam  BP (!) 167/127 (BP Location: Right Arm)   Pulse 99   Temp 98.2 F (36.8 C) (Oral)   Wt 108.4 kg   LMP 04/18/2019 (Approximate)   SpO2 99%   BMI 46.68 kg/m   Constitutional: Well developed, well nourished, no acute distress Eyes:  EOMI, conjunctiva normal bilaterally HENT: Normocephalic, atraumatic,mucus membranes moist.  No nasal congestion.  No sinus tenderness.  Unable to smell alcohol pad Respiratory: Normal inspiratory effort lungs clear bilaterally, good air movement Cardiovascular: Normal rate regular rhythm no murmurs rubs or gallops GI: nondistended skin: No rash, skin intact Musculoskeletal: no deformities no pitting edema bilaterally. Neurologic: Alert & oriented x 3, cranial nerves III through XII intact, speech fluent, moving all extremities.  Coordination normal. Psychiatric: Speech and behavior appropriate   ED Course   Medications - No data to display  Orders Placed This Encounter  Procedures  . Novel Coronavirus, NAA ([facility])    Standing Status:   Standing    Number of Occurrences:   1    Order Specific Question:   Patient immune status    Answer:   Normal    No results found for this or any previous visit (from the past 24 hour(s)). No results found.  ED Clinical Impression  1. Anosmia   2. Essential hypertension   3. Encounter for laboratory testing for COVID-19 virus   4. Wheeze      ED Assessment/Plan  1.  Loss of sense of taste or smell.  Will send off Covid PCR test.  She denies any other symptoms other than occasional cough, chills.  She denies head trauma, she is neurologically intact.  We will have her continue self-isolation as if she has Covid, and follow-up with Dr. Glade Lloyd.  2.  Hypertension.  It was elevated on her last visit around in this range.  She currently denies any symptoms.  No anxiety, palpitations, lightheadedness.  No syncope, seizures. has Not taken blood pressure medication in years-does not remember what she was  on.  Pt has no historical evidence of end organ damage. Pt denies any CNS type sx such as visual changes, focal paresis, or new onset seizure activity.  She had a mild headache this am which has resolved with Tylenol.  Pt denies any CV sx such as CP, dyspnea, palpitations, pedal edema, tearing pain radiating to back or abd. Pt denied any renal sx such as anuria or hematuria. Pt denies illicit drug use, most notably cocaine, or recent use of OTC medications such as nasal decongestants. Discussed importance of lifestyle modifications as important first steps, and the importance of taking BP medications.  We will start amlodipine 5 mg daily.  pt to buy blood pressure cuff, keep a log of her blood pressure and bring the log and blood pressure cuff with her to her next doctor's visit.  She is to follow-up with Dr. Glade Lloyd or may return here in 1-2 weeks.  Discussed labs, MDM, treatment plan, and plan for follow-up with patient. Discussed sn/sx that should prompt return to the ED. patient agrees with plan.   Meds ordered this encounter  Medications  . amLODipine (NORVASC) 5 MG tablet    Sig: Take 1 tablet (5 mg total) by mouth daily.    Dispense:  30 tablet    Refill:  0    *This clinic note was created using Scientist, clinical (histocompatibility and immunogenetics). Therefore, there may be occasional mistakes despite careful proofreading.   ?   Domenick Gong, MD 05/13/19 4801    Domenick Gong, MD 05/13/19 [phone]

## 2019-05-12 NOTE — Discharge Instructions (Signed)
Decrease your salt intake. diet and exercise will lower your blood pressure significantly. It is important to keep your blood pressure under good control, as having a elevated blood pressure for prolonged periods of time significantly increases your risk of stroke, heart attacks, kidney damage, eye damage, and other problems. Measure your blood pressure once a day, preferably at the same time every day. Keep a log of this and bring it to your next doctor's appointment.  Bring your blood pressure cuff as well.  Return here in 10 days-2 weeks for blood pressure recheck if you're unable to find a primary care physician by then. Return immediately to the ER if you start having chest pain, headache, problems seeing, problems talking, problems walking, if you feel like you're about to pass out, if you do pass out, if you have a seizure, or for any other concerns.  Covid PCR will be back in 18 to 48 hours.  Isolate yourself as if you do have Covid.   Go to www.goodrx.com to look up your medications. This will give you a list of where you can find your prescriptions at the most affordable prices. Or ask the pharmacist what the cash price is, or if they have any other discount programs available to help make your medication more affordable. This can be less expensive than what you would pay with insurance.

## 2019-05-13 LAB — NOVEL CORONAVIRUS, NAA: SARS-CoV-2, NAA: NOT DETECTED

## 2019-05-16 ENCOUNTER — Telehealth: Payer: Self-pay

## 2019-05-16 NOTE — Telephone Encounter (Signed)
Called for work note to ok return. Ok per [employer].

## 2019-06-04 ENCOUNTER — Encounter: Payer: Self-pay | Admitting: Family Medicine

## 2019-06-04 ENCOUNTER — Other Ambulatory Visit: Payer: Self-pay

## 2019-06-04 ENCOUNTER — Ambulatory Visit (INDEPENDENT_AMBULATORY_CARE_PROVIDER_SITE_OTHER): Payer: Managed Care, Other (non HMO) | Admitting: Family Medicine

## 2019-06-04 DIAGNOSIS — I10 Essential (primary) hypertension: Secondary | ICD-10-CM

## 2019-06-04 DIAGNOSIS — F418 Other specified anxiety disorders: Secondary | ICD-10-CM

## 2019-06-04 NOTE — Patient Instructions (Signed)
Great to meet you! Keep working towards your goals through lifestyle change.  It may be tough at first but will get easier as it becomes habit and routine.  See me again in 6 months or sooner if needed.  Call with any questions.    Hypertension, Adult High blood pressure (hypertension) is when the force of blood pumping through the arteries is too strong. The arteries are the blood vessels that carry blood from the heart throughout the body. Hypertension forces the heart to work harder to pump blood and may cause arteries to become narrow or stiff. Untreated or uncontrolled hypertension can cause a heart attack, heart failure, a stroke, kidney disease, and other problems. A blood pressure reading consists of a higher number over a lower number. Ideally, your blood pressure should be below 120/80. The first ("top") number is called the systolic pressure. It is a measure of the pressure in your arteries as your heart beats. The second ("bottom") number is called the diastolic pressure. It is a measure of the pressure in your arteries as the heart relaxes. What are the causes? The exact cause of this condition is not known. There are some conditions that result in or are related to high blood pressure. What increases the risk? Some risk factors for high blood pressure are under your control. The following factors may make you more likely to develop this condition:  Smoking.  Having type 2 diabetes mellitus, high cholesterol, or both.  Not getting enough exercise or physical activity.  Being overweight.  Having too much fat, sugar, calories, or salt (sodium) in your diet.  Drinking too much alcohol. Some risk factors for high blood pressure may be difficult or impossible to change. Some of these factors include:  Having chronic kidney disease.  Having a family history of high blood pressure.  Age. Risk increases with age.  Race. You may be at higher risk if you are African  American.  Gender. Men are at higher risk than women before age 59. After age 60, women are at higher risk than men.  Having obstructive sleep apnea.  Stress. What are the signs or symptoms? High blood pressure may not cause symptoms. Very high blood pressure (hypertensive crisis) may cause:  Headache.  Anxiety.  Shortness of breath.  Nosebleed.  Nausea and vomiting.  Vision changes.  Severe chest pain.  Seizures. How is this diagnosed? This condition is diagnosed by measuring your blood pressure while you are seated, with your arm resting on a flat surface, your legs uncrossed, and your feet flat on the floor. The cuff of the blood pressure monitor will be placed directly against the skin of your upper arm at the level of your heart. It should be measured at least twice using the same arm. Certain conditions can cause a difference in blood pressure between your right and left arms. Certain factors can cause blood pressure readings to be lower or higher than normal for a short period of time:  When your blood pressure is higher when you are in a health care provider's office than when you are at home, this is called white coat hypertension. Most people with this condition do not need medicines.  When your blood pressure is higher at home than when you are in a health care provider's office, this is called masked hypertension. Most people with this condition may need medicines to control blood pressure. If you have a high blood pressure reading during one visit or you have normal  blood pressure with other risk factors, you may be asked to:  Return on a different day to have your blood pressure checked again.  Monitor your blood pressure at home for 1 week or longer. If you are diagnosed with hypertension, you may have other blood or imaging tests to help your health care provider understand your overall risk for other conditions. How is this treated? This condition is treated by  making healthy lifestyle changes, such as eating healthy foods, exercising more, and reducing your alcohol intake. Your health care provider may prescribe medicine if lifestyle changes are not enough to get your blood pressure under control, and if:  Your systolic blood pressure is above 130.  Your diastolic blood pressure is above 80. Your personal target blood pressure may vary depending on your medical conditions, your age, and other factors. Follow these instructions at home: Eating and drinking   Eat a diet that is high in fiber and potassium, and low in sodium, added sugar, and fat. An example eating plan is called the DASH (Dietary Approaches to Stop Hypertension) diet. To eat this way: ? Eat plenty of fresh fruits and vegetables. Try to fill one half of your plate at each meal with fruits and vegetables. ? Eat whole grains, such as whole-wheat pasta, brown rice, or whole-grain bread. Fill about one fourth of your plate with whole grains. ? Eat or drink low-fat dairy products, such as skim milk or low-fat yogurt. ? Avoid fatty cuts of meat, processed or cured meats, and poultry with skin. Fill about one fourth of your plate with lean proteins, such as fish, chicken without skin, beans, eggs, or tofu. ? Avoid pre-made and processed foods. These tend to be higher in sodium, added sugar, and fat.  Reduce your daily sodium intake. Most people with hypertension should eat less than 1,500 mg of sodium a day.  Do not drink alcohol if: ? Your health care provider tells you not to drink. ? You are pregnant, may be pregnant, or are planning to become pregnant.  If you drink alcohol: ? Limit how much you use to:  0-1 drink a day for women.  0-2 drinks a day for men. ? Be aware of how much alcohol is in your drink. In the [place] one drink equals one 12 oz bottle of beer (355 mL), one 5 oz glass of wine (148 mL), or one 1 oz glass of hard liquor (44 mL). Lifestyle   Work with your health  care provider to maintain a healthy body weight or to lose weight. Ask what an ideal weight is for you.  Get at least 30 minutes of exercise most days of the week. Activities may include walking, swimming, or biking.  Include exercise to strengthen your muscles (resistance exercise), such as Pilates or lifting weights, as part of your weekly exercise routine. Try to do these types of exercises for 30 minutes at least 3 days a week.  Do not use any products that contain nicotine or tobacco, such as cigarettes, e-cigarettes, and chewing tobacco. If you need help quitting, ask your health care provider.  Monitor your blood pressure at home as told by your health care provider.  Keep all follow-up visits as told by your health care provider. This is important. Medicines  Take over-the-counter and prescription medicines only as told by your health care provider. Follow directions carefully. Blood pressure medicines must be taken as prescribed.  Do not skip doses of blood pressure medicine. Doing this puts  you at risk for problems and can make the medicine less effective.  Ask your health care provider about side effects or reactions to medicines that you should watch for. Contact a health care provider if you:  Think you are having a reaction to a medicine you are taking.  Have headaches that keep coming back (recurring).  Feel dizzy.  Have swelling in your ankles.  Have trouble with your vision. Get help right away if you:  Develop a severe headache or confusion.  Have unusual weakness or numbness.  Feel faint.  Have severe pain in your chest or abdomen.  Vomit repeatedly.  Have trouble breathing. Summary  Hypertension is when the force of blood pumping through your arteries is too strong. If this condition is not controlled, it may put you at risk for serious complications.  Your personal target blood pressure may vary depending on your medical conditions, your age, and  other factors. For most people, a normal blood pressure is less than 120/80.  Hypertension is treated with lifestyle changes, medicines, or a combination of both. Lifestyle changes include losing weight, eating a healthy, low-sodium diet, exercising more, and limiting alcohol. This information is not intended to replace advice given to you by your health care provider. Make sure you discuss any questions you have with your health care provider. Document Revised: 12/14/2017 Document Reviewed: 12/14/2017 Elsevier Patient Education  2020 [employer].

## 2019-06-05 ENCOUNTER — Other Ambulatory Visit: Payer: Self-pay

## 2019-06-05 ENCOUNTER — Encounter: Payer: Self-pay | Admitting: Family Medicine

## 2019-06-05 MED ORDER — AMLODIPINE BESYLATE 5 MG PO TABS: 5.0000 mg | ORAL_TABLET | Freq: Every day | ORAL | 1 refills | Status: DC

## 2019-06-05 NOTE — Assessment & Plan Note (Signed)
Blood pressure is at goal at for age and co-morbidities.  I recommend she continue amlodipine at current dose. In addition they were instructed to follow a low sodium diet with regular exercise to help to maintain adequate control of blood pressure.  ° °

## 2019-06-05 NOTE — Progress Notes (Signed)
Connie Sanders - 44 y.o. female MRN [id number]  Date of birth: [birth year]  Subjective Chief Complaint  Patient presents with  . Establish Care  . Hypertension    HPI Connie Sanders is a 44 y.o. female here today for initial visit.  She has recent dx of HTN and history of depression and anxiety.  She was recetnly started on amlodipine for management of her blood pressure.  She feels like she is doing ok on this.  She has not noticed side effects. She admits to increased stress at work.  She feels that bupropion and vistaril are working pretty well for her.  Her goal is to work on lifestyle change to help improve her blood pressure and eventually come off of medication.  She is optimistic about reaching these goals.  She denies symptoms related to her blood pressure at this time including chest pain, headache, shortness of breath, palpitations.   ROS:  A comprehensive ROS was completed and negative except as noted per HPI  Allergies  Allergen Reactions  . Sulfonamide Derivatives Anaphylaxis    REACTION: facial swelling    No past medical history on file.  Past Surgical History:  Procedure Laterality Date  . APPENDECTOMY    . CESAREAN SECTION      Social History   Socioeconomic History  . Marital status: Married    Spouse name: Not on file  . Number of children: Not on file  . Years of education: Not on file  . Highest education level: Not on file  Occupational History  . Not on file  Tobacco Use  . Smoking status: Never Smoker  . Smokeless tobacco: Never Used  Substance and Sexual Activity  . Alcohol use: Not Currently  . Drug use: Not Currently  . Sexual activity: Not on file  Other Topics Concern  . Not on file  Social History Narrative  . Not on file   Social Determinants of Health   Financial Resource Strain:   . Difficulty of Paying Living Expenses: Not on file  Food Insecurity:   . Worried About Programme researcher, broadcasting/film/video in the Last Year: Not on file  .  Ran Out of Food in the Last Year: Not on file  Transportation Needs:   . Lack of Transportation (Medical): Not on file  . Lack of Transportation (Non-Medical): Not on file  Physical Activity:   . Days of Exercise per Week: Not on file  . Minutes of Exercise per Session: Not on file  Stress:   . Feeling of Stress : Not on file  Social Connections:   . Frequency of Communication with Friends and Family: Not on file  . Frequency of Social Gatherings with Friends and Family: Not on file  . Attends Religious Services: Not on file  . Active Member of Clubs or Organizations: Not on file  . Attends Banker Meetings: Not on file  . Marital Status: Not on file    No family history on file.  Health Maintenance  Topic Date Due  . PAP SMEAR-Modifier  06/04/2019  . TETANUS/TDAP  06/03/2020 (Originally 06/11/2017)  . INFLUENZA VACCINE  Completed  . HIV Screening  Completed    ----------------------------------------------------------------------------------------------------------------------------------------------------------------------------------------------------------------- Physical Exam BP 138/86   Pulse 97   Temp 98 F (36.7 C) (Oral)   Ht 5\' 4"  (1.626 m)   Wt 234 lb (106.1 kg)   BMI 40.17 kg/m   Physical Exam Constitutional:      Appearance: Normal appearance.  HENT:     Head: Normocephalic and atraumatic.     Mouth/Throat:     Mouth: Mucous membranes are moist.  Eyes:     General: No scleral icterus. Cardiovascular:     Rate and Rhythm: Normal rate and regular rhythm.  Pulmonary:     Effort: Pulmonary effort is normal.     Breath sounds: Normal breath sounds.  Musculoskeletal:     Cervical back: Neck supple.  Skin:    General: Skin is warm.  Neurological:     General: No focal deficit present.     Mental Status: She is alert.  Psychiatric:        Mood and Affect: Mood normal.        Behavior: Behavior normal.      ------------------------------------------------------------------------------------------------------------------------------------------------------------------------------------------------------------------- Assessment and Plan  ESSENTIAL HYPERTENSION, BENIGN Blood pressure is at goal at for age and co-morbidities.  I recommend she continue amlodipine at current dose.  In addition they were instructed to follow a low sodium diet with regular exercise to help to maintain adequate control of blood pressure.    Depression with anxiety Stable with current dose of bupropion and hydroxyzine.     This visit occurred during the SARS-CoV-2 public health emergency.  Safety protocols were in place, including screening questions prior to the visit, additional usage of staff PPE, and extensive cleaning of exam room while observing appropriate contact time as indicated for disinfecting solutions.

## 2019-06-05 NOTE — Assessment & Plan Note (Signed)
Stable with current dose of bupropion and hydroxyzine.

## 2019-06-05 NOTE — Telephone Encounter (Signed)
Shequila called and states the pharmacy didn't receive the Amlodipine 5 mg refill. [facility].

## 2019-11-08 ENCOUNTER — Other Ambulatory Visit: Payer: Self-pay

## 2019-11-08 ENCOUNTER — Ambulatory Visit (INDEPENDENT_AMBULATORY_CARE_PROVIDER_SITE_OTHER): Payer: Managed Care, Other (non HMO) | Admitting: Family Medicine

## 2019-11-08 ENCOUNTER — Encounter: Payer: Self-pay | Admitting: Family Medicine

## 2019-11-08 DIAGNOSIS — Z7185 Encounter for immunization safety counseling: Secondary | ICD-10-CM | POA: Insufficient documentation

## 2019-11-08 DIAGNOSIS — Z7189 Other specified counseling: Secondary | ICD-10-CM

## 2019-11-08 DIAGNOSIS — I10 Essential (primary) hypertension: Secondary | ICD-10-CM

## 2019-11-08 NOTE — Progress Notes (Signed)
Connie Sanders - 44 y.o. female MRN [id number]  Date of birth: 1975/11/30  Subjective Chief Complaint  Patient presents with  . Advice Only    HPI Connie Sanders is a 44 y.o. female with history of HTN and anxiety here today to discuss vaccine.  She reports that her employer is planning on making COVID vaccine a requirement.  She states that she still has some hesitancy about the vaccines as she gets patient complaint calls and she often hears about adverse reactions to the vaccine.  She is also a little hesitant about side effect reported by the media.  She did have injection site reaction related to flu vaccine a couple of years ago and is a little nervous about this as well.   ROS:  A comprehensive ROS was completed and negative except as noted per HPI  Allergies  Allergen Reactions  . Sulfonamide Derivatives Anaphylaxis    REACTION: facial swelling    History reviewed. No pertinent past medical history.  Past Surgical History:  Procedure Laterality Date  . APPENDECTOMY    . CESAREAN SECTION      Social History   Socioeconomic History  . Marital status: Married    Spouse name: Not on file  . Number of children: Not on file  . Years of education: Not on file  . Highest education level: Not on file  Occupational History  . Not on file  Tobacco Use  . Smoking status: Never Smoker  . Smokeless tobacco: Never Used  Vaping Use  . Vaping Use: Never used  Substance and Sexual Activity  . Alcohol use: Not Currently  . Drug use: Not Currently  . Sexual activity: Not on file  Other Topics Concern  . Not on file  Social History Narrative  . Not on file   Social Determinants of Health   Financial Resource Strain:   . Difficulty of Paying Living Expenses:   Food Insecurity:   . Worried About Programme researcher, broadcasting/film/video in the Last Year:   . Barista in the Last Year:   Transportation Needs:   . Freight forwarder (Medical):   Marland Kitchen Lack of Transportation  (Non-Medical):   Physical Activity:   . Days of Exercise per Week:   . Minutes of Exercise per Session:   Stress:   . Feeling of Stress :   Social Connections:   . Frequency of Communication with Friends and Family:   . Frequency of Social Gatherings with Friends and Family:   . Attends Religious Services:   . Active Member of Clubs or Organizations:   . Attends Banker Meetings:   Marland Kitchen Marital Status:     History reviewed. No pertinent family history.  Health Maintenance  Topic Date Due  . Hepatitis C Screening  Never done  . COVID-19 Vaccine (1) Never done  . PAP SMEAR-Modifier  06/04/2019  . TETANUS/TDAP  06/03/2020 (Originally 06/11/2017)  . INFLUENZA VACCINE  11/18/2019  . HIV Screening  Completed     ----------------------------------------------------------------------------------------------------------------------------------------------------------------------------------------------------------------- Physical Exam BP (!) 174/93 (BP Location: Left Arm, Patient Position: Sitting, Cuff Size: Large)   Pulse 74   Wt (!) 232 lb 11.2 oz (105.6 kg)   SpO2 100%   BMI 39.94 kg/m   Physical Exam Constitutional:      Appearance: Normal appearance.  Neurological:     General: No focal deficit present.     Mental Status: She is alert.  Psychiatric:  Mood and Affect: Mood normal.        Behavior: Behavior normal.     ------------------------------------------------------------------------------------------------------------------------------------------------------------------------------------------------------------------- Assessment and Plan  Vaccine counseling Discussed my recommendations for her to have COVID vaccine.  I let her know that while no vaccine can provide 100% prevention that vaccines that are available are highly effective at prevention of COVID and even better at providing reduced hospitalization risk. Reviewed mRNA technology used  in DIRECTV and moderna vaccines and how the evidence available points to this being a safe mechanism of vaccination. Reviewed that incidences of blood clot and other significant complications related to vaccines seems to be very low.  I did let her know that she may experience flu like symptoms including fatigue, body aches, fever, chills, headaches, lymph node swelling or swelling around injection site and that these usually resolve within 24-48 hours of vaccine.   ESSENTIAL HYPERTENSION, BENIGN BP elevated but she is quite anxious today.  She will keep appt next month with me for follow up of this.    No orders of the defined types were placed in this encounter.   No follow-ups on file.  30 minutes spent including pre visit preparation, review of prior notes and labs, encounter with patient and same day documentation.   This visit occurred during the SARS-CoV-2 public health emergency.  Safety protocols were in place, including screening questions prior to the visit, additional usage of staff PPE, and extensive cleaning of exam room while observing appropriate contact time as indicated for disinfecting solutions.

## 2019-11-08 NOTE — Assessment & Plan Note (Signed)
Discussed my recommendations for her to have COVID vaccine.  I let her know that while no vaccine can provide 100% prevention that vaccines that are available are highly effective at prevention of COVID and even better at providing reduced hospitalization risk. Reviewed mRNA technology used in DIRECTV and moderna vaccines and how the evidence available points to this being a safe mechanism of vaccination. Reviewed that incidences of blood clot and other significant complications related to vaccines seems to be very low.  I did let her know that she may experience flu like symptoms including fatigue, body aches, fever, chills, headaches, lymph node swelling or swelling around injection site and that these usually resolve within 24-48 hours of vaccine.

## 2019-11-08 NOTE — Assessment & Plan Note (Signed)
BP elevated but she is quite anxious today.  She will keep appt next month with me for follow up of this.

## 2019-12-03 ENCOUNTER — Ambulatory Visit: Payer: Managed Care, Other (non HMO) | Admitting: Family Medicine

## 2019-12-13 ENCOUNTER — Other Ambulatory Visit: Payer: Self-pay | Admitting: Family Medicine

## 2019-12-20 ENCOUNTER — Encounter: Payer: Self-pay | Admitting: Family Medicine

## 2019-12-20 ENCOUNTER — Telehealth: Payer: Self-pay

## 2019-12-20 ENCOUNTER — Telehealth (INDEPENDENT_AMBULATORY_CARE_PROVIDER_SITE_OTHER): Payer: Managed Care, Other (non HMO) | Admitting: Family Medicine

## 2019-12-20 DIAGNOSIS — I10 Essential (primary) hypertension: Secondary | ICD-10-CM

## 2019-12-20 MED ORDER — CONTRAVE 8-90 MG PO TB12: ORAL_TABLET | ORAL | 0 refills | Status: DC

## 2019-12-20 NOTE — Assessment & Plan Note (Signed)
Discussed medication options to help with weight management.  I think contrave may be a good option as she wants to avoid phentermine containing products due to HTN and anxiety.  She is already on bupropion which I will have her discontinue and start contrave in place of this.  She will titrate as directed.  Discussed following healthy diet with regular exercise for best results    I will follow up with her in about 6 weeks.

## 2019-12-20 NOTE — Telephone Encounter (Signed)
Connie Sanders - Pt left a vm msg stating that Contrave rx will require prior authorization.   Dr. Ashley Royalty - pt forgot to mention during her appointment that she has a bad case of foot fungus. She mentioned that available OTC medications does not work for her. She is opened for any  Suggestions/recommendations. Pls advise, thanks.

## 2019-12-20 NOTE — Progress Notes (Signed)
Connie Sanders - 44 y.o. female MRN [id number]  Date of birth: 04-29-1975   This visit type was conducted due to national recommendations for restrictions regarding the COVID-19 Pandemic (e.g. social distancing).  This format is felt to be most appropriate for this patient at this time.  All issues noted in this document were discussed and addressed.  No physical exam was performed (except for noted visual exam findings with Video Visits).  I discussed the limitations of evaluation and management by telemedicine and the availability of in person appointments. The patient expressed understanding and agreed to proceed.  I connected with@ on 12/20/19 at  8:50 AM EDT by a video enabled telemedicine application and verified that I am speaking with the correct person using two identifiers.  Present at visit: Everrett Coombe, DO Adrian Saran   Patient Location: [facility] [address] [place] [address]   Provider location:   [facility]  Chief Complaint  Patient presents with  . Hypertension  . Weight Gain  . Medication Refill    Amlodipine    HPI  Connie Sanders is a 44 y.o. female who presents via audio/video conferencing for a telehealth visit today.  She is following up today for HTN.  She is doing well with amlodipine and reports that BP readings at home have been well controlled.  She denies side effects related to medication.  She has not had symptoms related to HTN including chest pain, shortness of breath, palpitations, headache or vision changes.   She also would like to discuss options to help with weight loss.  She has never tried medications to previously.  She is concerned about medications that contain phentermine due to possibility or raising her BP and causing increased anxiety.  She is already on bupropion for anxiety and we discussed contrave in addition to dietary and exercise changes.  Injectable medications also discussed briefly however she would prefer to stick  with oral medication.    ROS:  A comprehensive ROS was completed and negative except as noted per HPI    ROS:  A comprehensive ROS was completed and negative except as noted per HPI  No past medical history on file.  Past Surgical History:  Procedure Laterality Date  . APPENDECTOMY    . CESAREAN SECTION      No family history on file.  Social History   Socioeconomic History  . Marital status: Married    Spouse name: Not on file  . Number of children: Not on file  . Years of education: Not on file  . Highest education level: Not on file  Occupational History  . Not on file  Tobacco Use  . Smoking status: Never Smoker  . Smokeless tobacco: Never Used  Vaping Use  . Vaping Use: Never used  Substance and Sexual Activity  . Alcohol use: Not Currently  . Drug use: Not Currently  . Sexual activity: Not on file  Other Topics Concern  . Not on file  Social History Narrative  . Not on file   Social Determinants of Health   Financial Resource Strain:   . Difficulty of Paying Living Expenses: Not on file  Food Insecurity:   . Worried About Programme researcher, broadcasting/film/video in the Last Year: Not on file  . Ran Out of Food in the Last Year: Not on file  Transportation Needs:   . Lack of Transportation (Medical): Not on file  . Lack of Transportation (Non-Medical): Not on file  Physical Activity:   .  Days of Exercise per Week: Not on file  . Minutes of Exercise per Session: Not on file  Stress:   . Feeling of Stress : Not on file  Social Connections:   . Frequency of Communication with Friends and Family: Not on file  . Frequency of Social Gatherings with Friends and Family: Not on file  . Attends Religious Services: Not on file  . Active Member of Clubs or Organizations: Not on file  . Attends Banker Meetings: Not on file  . Marital Status: Not on file  Intimate Partner Violence:   . Fear of Current or Ex-Partner: Not on file  . Emotionally Abused: Not on file  .  Physically Abused: Not on file  . Sexually Abused: Not on file     Current Outpatient Medications:  .  amLODipine (NORVASC) 5 MG tablet, TAKE 1 TABLET BY MOUTH EVERY DAY, Disp: 30 tablet, Rfl: 5 .  buPROPion (WELLBUTRIN) 100 MG tablet, Take 100 mg by mouth 2 (two) times daily., Disp: , Rfl:  .  hydrOXYzine (ATARAX/VISTARIL) 10 MG tablet, Take 10 mg by mouth 3 (three) times daily as needed., Disp: , Rfl:  .  Multiple Vitamin (MULTI-DAY VITAMINS PO), Take by mouth., Disp: , Rfl:  .  Naltrexone-buPROPion HCl ER (CONTRAVE) 8-90 MG TB12, Start 1 tablet every morning for 7 days, then 1 tablet twice daily for 7 days, then 2 tablets every morning and one every evening, Disp: 120 tablet, Rfl: 0  EXAM:  VITALS per patient if applicable: Temp (!) 97.4 F (36.3 C) (Oral)   Wt 230 lb 9.6 oz (104.6 kg)   LMP 11/21/2019   BMI 39.58 kg/m   GENERAL: alert, oriented, appears well and in no acute distress  HEENT: atraumatic, conjunttiva clear, no obvious abnormalities on inspection of external nose and ears  NECK: normal movements of the head and neck  LUNGS: on inspection no signs of respiratory distress, breathing rate appears normal, no obvious gross SOB, gasping or wheezing  CV: no obvious cyanosis  MS: moves all visible extremities without noticeable abnormality  PSYCH/NEURO: pleasant and cooperative, no obvious depression or anxiety, speech and thought processing grossly intact  ASSESSMENT AND PLAN:  Discussed the following assessment and plan:  ESSENTIAL HYPERTENSION, BENIGN BP has been well controlled with amlodipine.  She will continue at current dose.  Recommend low sodium diet with weight loss as well.    OBESITY Discussed medication options to help with weight management.  I think contrave may be a good option as she wants to avoid phentermine containing products due to HTN and anxiety.  She is already on bupropion which I will have her discontinue and start contrave in place  of this.  She will titrate as directed.  Discussed following healthy diet with regular exercise for best results    I will follow up with her in about 6 weeks.      I discussed the assessment and treatment plan with the patient. The patient was provided an opportunity to ask questions and all were answered. The patient agreed with the plan and demonstrated an understanding of the instructions.   The patient was advised to call back or seek an in-person evaluation if the symptoms worsen or if the condition fails to improve as anticipated.    Everrett Coombe, DO

## 2019-12-20 NOTE — Assessment & Plan Note (Signed)
BP has been well controlled with amlodipine.  She will continue at current dose.  Recommend low sodium diet with weight loss as well.

## 2019-12-20 NOTE — Telephone Encounter (Signed)
I would recommend she make an additional appointment to address this.  In office preferable.

## 2019-12-21 NOTE — Telephone Encounter (Signed)
I called and left pt a vm to call our office to schedule an appt with Dr.Matthews to discuss this issue If she would like

## 2019-12-25 ENCOUNTER — Telehealth: Payer: Self-pay | Admitting: Family Medicine

## 2019-12-25 NOTE — Telephone Encounter (Signed)
Received fax for PA on Contrave Sent through cover my meds waiting on determination. Also filled out form and faxed it back. - CF

## 2019-12-26 NOTE — Telephone Encounter (Signed)
I sent the PA on 12/25/19 for contrave still waiting on determination. - CF

## 2019-12-27 NOTE — Telephone Encounter (Signed)
Received fax from Connie Sanders they denied coverage on Contrave due to it didn't meet criteria for drug approval.  Placing in providers box for determination. - CF

## 2019-12-28 ENCOUNTER — Encounter: Payer: Self-pay | Admitting: Family Medicine

## 2020-02-03 ENCOUNTER — Other Ambulatory Visit: Payer: Self-pay | Admitting: Family Medicine

## 2020-03-03 ENCOUNTER — Other Ambulatory Visit: Payer: Self-pay | Admitting: Family Medicine

## 2020-03-05 NOTE — Telephone Encounter (Signed)
Is she wanting the starter pack again or is she at the maintenance dose again. Also please confirm if this is the correct pharmacy.

## 2020-03-06 NOTE — Telephone Encounter (Signed)
Sent a MyChart message to [employer] requesting clarification.

## 2020-03-07 ENCOUNTER — Other Ambulatory Visit: Payer: Self-pay

## 2020-03-07 MED ORDER — CONTRAVE 8-90 MG PO TB12: ORAL_TABLET | ORAL | 0 refills | Status: DC

## 2020-03-12 ENCOUNTER — Telehealth: Payer: Self-pay | Admitting: *Deleted

## 2020-03-12 NOTE — Telephone Encounter (Signed)
PA submitted through cover My Meds for Contrave.

## 2020-04-08 ENCOUNTER — Other Ambulatory Visit: Payer: Self-pay | Admitting: Family Medicine

## 2020-04-08 MED ORDER — CONTRAVE 8-90 MG PO TB12: ORAL_TABLET | ORAL | 1 refills | Status: DC

## 2020-04-08 NOTE — Telephone Encounter (Signed)
Please reach back out to her regarding message from 11/18.  It doesn't appear that she read this in MyChart.    Thanks.

## 2020-05-20 ENCOUNTER — Telehealth: Payer: Self-pay

## 2020-05-20 NOTE — Telephone Encounter (Signed)
LVM stating he husband has been diagnosed with long-haul COVID. The family is down to 1 income. Unable to afford Contrave.   Pharmacy suggests the medication be separated and sent individually.  Wellbutrin 90mg  and Naltexone 8mg   Please advise.

## 2020-05-22 ENCOUNTER — Other Ambulatory Visit: Payer: Self-pay | Admitting: Family Medicine

## 2020-05-22 NOTE — Telephone Encounter (Signed)
Is she still taking the 100mg  bupropion?

## 2020-05-23 NOTE — Telephone Encounter (Signed)
Spoke to [employer]. She is not taking the bupropion. Has not been getting refills.

## 2020-05-25 ENCOUNTER — Other Ambulatory Visit: Payer: Self-pay | Admitting: Family Medicine

## 2020-05-25 MED ORDER — NALTREXONE HCL 50 MG PO TABS: 25.0000 mg | ORAL_TABLET | Freq: Every day | ORAL | 1 refills | Status: AC

## 2020-05-25 MED ORDER — BUPROPION HCL 100 MG PO TABS: 100.0000 mg | ORAL_TABLET | Freq: Two times a day (BID) | ORAL | 1 refills | Status: AC

## 2020-05-25 NOTE — Telephone Encounter (Signed)
Separate rx's sent in for bupropion and naltrexone.  Dosing is not exact same as contrave. Bupropion 100mg  BID and naltrexone 25mg  once per day.

## 2020-06-25 ENCOUNTER — Other Ambulatory Visit: Payer: Self-pay | Admitting: Family Medicine

## 2020-06-25 NOTE — Telephone Encounter (Signed)
Please contact patient to schedule HTN follow-up appt. Dr. Ashley Royalty had previously requested a 6 week followup after Sept 2021 appt. Pt has not been seen since then.   Sending 30 day refill to hold her until appt.

## 2020-06-25 NOTE — Telephone Encounter (Signed)
LVM FOR patient to call back to get this appt scheduled. AM

## 2020-07-22 ENCOUNTER — Other Ambulatory Visit: Payer: Self-pay | Admitting: Family Medicine

## 2020-08-20 ENCOUNTER — Encounter: Payer: Self-pay | Admitting: Family Medicine

## 2020-12-16 ENCOUNTER — Other Ambulatory Visit: Payer: Self-pay | Admitting: Physician Assistant

## 2020-12-16 DIAGNOSIS — L237 Allergic contact dermatitis due to plants, except food: Secondary | ICD-10-CM

## 2020-12-16 MED ORDER — TRIAMCINOLONE ACETONIDE 0.1 % EX CREA: 1.0000 "application " | TOPICAL_CREAM | Freq: Two times a day (BID) | CUTANEOUS | 0 refills | Status: AC

## 2020-12-16 MED ORDER — PREDNISONE 10 MG PO TABS: ORAL_TABLET | ORAL | 0 refills | Status: AC

## 2020-12-16 NOTE — Progress Notes (Signed)
Patient seen virtually via [facility] on [address] for contact dermatitis. Unable to get scripts to go through so sent via her Epic chart after again verifying name, DOB and address.

## 2021-11-02 ENCOUNTER — Telehealth: Payer: Self-pay | Admitting: General Practice

## 2021-11-02 NOTE — Telephone Encounter (Signed)
Transition Care Management Follow-up Telephone Call Date of discharge and from where: 10/28/21 from [facility] How have you been since you were released from the hospital? Patient is doing better but still in some pain. She has started PT. Any questions or concerns? No  Items Reviewed: Did the pt receive and understand the discharge instructions provided? Yes  Medications obtained and verified? No  Other? No  Any new allergies since your discharge? No  Dietary orders reviewed? Yes Do you have support at home? Yes   Home Care and Equipment/Supplies: Were home health services ordered? no  Functional Questionnaire: (I = Independent and D = Dependent) ADLs: I  Bathing/Dressing- I  Meal Prep- I  Eating- I  Maintaining continence- I  Transferring/Ambulation- I  Managing Meds- I  Follow up appointments reviewed:  PCP Hospital f/u appt confirmed? No  Patient will call back to schedule. Specialist Hospital f/u appt confirmed? No   Are transportation arrangements needed? No  If their condition worsens, is the pt aware to call PCP or go to the Emergency Dept.? Yes Was the patient provided with contact information for the PCP's office or ED? Yes Was to pt encouraged to call back with questions or concerns? Yes
